# Patient Record
Sex: Male | Born: 1980 | Hispanic: Yes | Marital: Single | Smoking: Never smoker
Health system: Southern US, Community
[De-identification: ages and names within clinical notes are randomized; demographics above are authoritative.]

---

## 2017-09-19 ENCOUNTER — Emergency Department (HOSPITAL_COMMUNITY)
Admission: EM | Admit: 2017-09-19 | Discharge: 2017-09-19 | Disposition: A | Discharge status: Home / Self Care | Payer: Self-pay | Attending: Emergency Medicine | Admitting: Emergency Medicine

## 2017-09-19 ENCOUNTER — Encounter (HOSPITAL_COMMUNITY): Payer: Self-pay | Admitting: Emergency Medicine

## 2017-09-19 ENCOUNTER — Emergency Department (HOSPITAL_COMMUNITY): Payer: Self-pay

## 2017-09-19 DIAGNOSIS — S61301A Unspecified open wound of left index finger with damage to nail, initial encounter: Secondary | ICD-10-CM | POA: Insufficient documentation

## 2017-09-19 DIAGNOSIS — Y939 Activity, unspecified: Secondary | ICD-10-CM | POA: Insufficient documentation

## 2017-09-19 DIAGNOSIS — S6710XA Crushing injury of unspecified finger(s), initial encounter: Secondary | ICD-10-CM

## 2017-09-19 DIAGNOSIS — Z23 Encounter for immunization: Secondary | ICD-10-CM | POA: Insufficient documentation

## 2017-09-19 DIAGNOSIS — S67191A Crushing injury of left index finger, initial encounter: Secondary | ICD-10-CM | POA: Insufficient documentation

## 2017-09-19 DIAGNOSIS — S6992XA Unspecified injury of left wrist, hand and finger(s), initial encounter: Secondary | ICD-10-CM

## 2017-09-19 DIAGNOSIS — Y9289 Other specified places as the place of occurrence of the external cause: Secondary | ICD-10-CM | POA: Insufficient documentation

## 2017-09-19 DIAGNOSIS — W231XXA Caught, crushed, jammed, or pinched between stationary objects, initial encounter: Secondary | ICD-10-CM | POA: Insufficient documentation

## 2017-09-19 DIAGNOSIS — Y990 Civilian activity done for income or pay: Secondary | ICD-10-CM | POA: Insufficient documentation

## 2017-09-19 MED ORDER — LIDOCAINE HCL 2 % IJ SOLN
5.0000 mL | Freq: Once | INTRAMUSCULAR | Status: AC
  Administered 2017-09-19: 100 mg via INTRADERMAL
  Filled 2017-09-19: qty 20

## 2017-09-19 MED ORDER — CEPHALEXIN 500 MG PO CAPS: 500.0000 mg | ORAL_CAPSULE | Freq: Three times a day (TID) | ORAL | 0 refills | Status: AC

## 2017-09-19 MED ORDER — IBUPROFEN 400 MG PO TABS
600.0000 mg | ORAL_TABLET | Freq: Once | ORAL | Status: AC
  Administered 2017-09-19: 23:00:00 600 mg via ORAL
  Filled 2017-09-19: qty 1

## 2017-09-19 MED ORDER — TETANUS-DIPHTH-ACELL PERTUSSIS 5-2.5-18.5 LF-MCG/0.5 IM SUSP
0.5000 mL | Freq: Once | INTRAMUSCULAR | Status: AC
  Administered 2017-09-19: 0.5 mL via INTRAMUSCULAR
  Filled 2017-09-19: qty 0.5

## 2017-09-19 MED ORDER — ACETAMINOPHEN 500 MG PO TABS
1000.0000 mg | ORAL_TABLET | Freq: Once | ORAL | Status: AC
  Administered 2017-09-19: 1000 mg via ORAL
  Filled 2017-09-19: qty 2

## 2017-09-19 MED ORDER — CEPHALEXIN 250 MG PO CAPS
500.0000 mg | ORAL_CAPSULE | Freq: Once | ORAL | Status: AC
  Administered 2017-09-19: 500 mg via ORAL
  Filled 2017-09-19: qty 2

## 2017-09-19 NOTE — ED Triage Notes (Signed)
Interpreter used. Pt states he was at work when a metal beam dropped on L pointer finger. Pt nail is off the finger. Bleeding noted but controlled. Tetanus shot up to date

## 2017-09-19 NOTE — Discharge Instructions (Signed)
Take Tylenol 1000 mg 4 times a day for 1 week. This is the maximum dose of Tylenol (acetaminophen) you can take from all sources. Please check other over-the-counter medications and prescriptions to ensure you are not taking other medications that contain acetaminophen.  You may also take ibuprofen 400 mg 6 times a day alternating with or at the same time as tylenol.  °

## 2017-09-19 NOTE — ED Notes (Signed)
ED Provider at bedside. 

## 2017-09-19 NOTE — ED Provider Notes (Signed)
[facility]-EMERGENCY DEPT Provider Note   CSN: [id number] Arrival date & time: 09/19/17  1636     History   Chief Complaint Chief Complaint  Patient presents with  . Finger Injury    HPI Matthew Dyer is a 36 y.o. male.  HPI   36 year old right-handed male presents to concern for left index finger injury while at work. Reports a steel beam fell onto his left index finger while she was at work at 3:00. Pain was mild without stimulation to the area, but is 8 out of 10 with movement or palpation. He is not sure if he is up-to-date on tetanus. Bleeding is controlled. No other concern for injuries  Interpreter used  History reviewed. No pertinent past medical history.  There are no active problems to display for this patient.   History reviewed. No pertinent surgical history.     Home Medications    Prior to Admission medications   Medication Sig Start Date End Date Taking? Authorizing Provider  cephALEXin (KEFLEX) 500 MG capsule Take 1 capsule (500 mg total) by mouth 3 (three) times daily. 09/19/17 09/26/17  Alvira Monday, MD    Family History No family history on file.  Social History Social History  Substance Use Topics  . Smoking status: Never Smoker  . Smokeless tobacco: Never Used  . Alcohol use No     Allergies   Patient has no known allergies.   Review of Systems Review of Systems  Gastrointestinal: Negative for vomiting.  Musculoskeletal: Positive for arthralgias.  Skin: Positive for wound.     Physical Exam Updated Vital Signs BP (!) 142/97 (BP Location: Right Arm)   Pulse 72   Temp 98.2 F (36.8 C) (Oral)   Resp 16   Ht  (1.651 m)   Wt 72.6 kg (160 lb)   SpO2 98%   BMI 26.63 kg/m   Physical Exam  Constitutional: He is oriented to person, place, and time. He appears well-developed and well-nourished. No distress.  HENT:  Head: Normocephalic and atraumatic.  Eyes: Conjunctivae and EOM are normal.  Neck: Normal range of motion.   Cardiovascular: Normal rate, regular rhythm and intact distal pulses.   Pulmonary/Chest: Effort normal. No respiratory distress.  Musculoskeletal: He exhibits no edema.  Neurological: He is alert and oriented to person, place, and time.  Skin: Skin is warm and dry. He is not diaphoretic.  Nail avulsion to left index finger Thin avulsed areas of skin on medial side and latera tip. approx .75cm crush laceration to nailbed, thin tissue overlying bone  Nursing note and vitals reviewed.    ED Treatments / Results  Labs (all labs ordered are listed, but only abnormal results are displayed) Labs Reviewed - No data to display  EKG  EKG Interpretation None       Radiology Dg Finger Index Left  Result Date: 09/19/2017 CLINICAL DATA:  Metal beam fell on the left index finger. EXAM: LEFT INDEX FINGER 2+V COMPARISON:  None. FINDINGS: Soft tissue and/or fingernail avulsion at the tip of the left index finger. No underlying fracture is identified. Joint spaces are maintained. IMPRESSION: Soft tissue and/or fingernail avulsion involving the tip of the left index finger. No acute fracture is identified or dislocation noted. Electronically Signed   By: Tollie Eth M.D.   On: 09/19/2017 19:37    Procedures .Nerve Block Date/Time: 09/20/2017 12:33 AM Performed by: Alvira Monday Authorized by: Alvira Monday   Consent:    Consent obtained:  Verbal  Risks discussed:  Infection and pain   Alternatives discussed:  No treatment Indications:    Indications:  Pain relief and procedural anesthesia Location:    Body area:  Upper extremity   Upper extremity nerve blocked: digital block. Pre-procedure details:    Skin preparation:  2% chlorhexidine Procedure details (see MAR for exact dosages):    Block needle gauge:  25 G   Injection procedure:  Anatomic landmarks identified Post-procedure details:    Outcome:  Anesthesia achieved   Patient tolerance of procedure:  Tolerated well, no  immediate complications   (including critical care time)  Medications Ordered in ED Medications  Tdap (BOOSTRIX) injection 0.5 mL (0.5 mLs Intramuscular Given 09/19/17 2239)  ibuprofen (ADVIL,MOTRIN) tablet 600 mg (600 mg Oral Given 09/19/17 2237)  acetaminophen (TYLENOL) tablet 1,000 mg (1,000 mg Oral Given 09/19/17 2237)  lidocaine (XYLOCAINE) 2 % (with pres) injection 100 mg (100 mg Intradermal Given 09/19/17 2244)  cephALEXin (KEFLEX) capsule 500 mg (500 mg Oral Given 09/19/17 2345)     Initial Impression / Assessment and Plan / ED Course  I have reviewed the triage vital signs and the nursing notes.  Pertinent labs & imaging results that were available during my care of the patient were reviewed by me and considered in my medical decision making (see chart for details).     36 year old right-handed male who speaks Spanish presents to concern for left index finger injury while at work.  X-ray shows no sign of fracture. Discussed initial injury with Dr. Izora Ribas of hand surgery. Patient's tetanus immunization was updated.  Irrigated and cleaned finger after performing digital block. Debrided thin areas of skin at tip of finger that had been avulsed and were dry, thin, inappropriate for reattachment.  Nail bed with crush type laceration, no significant tissue to bring together. No exposed bone but note thin overlying tissue. Placed xerform to stent nailbed and as dressing to area, covered with kerlex and splint. Placed on empiric keflex.  Will follow up closely with Dr. Izora Ribas in the office. Rec ibuprofen and tylenol for pain. Discussed reasons to return in detail. Patient discharged in stable condition with understanding of reasons to return.   Final Clinical Impressions(s) / ED Diagnoses   Final diagnoses:  Crushing injury of finger, initial encounter  Injury of nail bed of finger of left hand, initial encounter    New Prescriptions Discharge Medication List as of 09/19/2017 11:40 PM      START taking these medications   Details  cephALEXin (KEFLEX) 500 MG capsule Take 1 capsule (500 mg total) by mouth 3 (three) times daily., Starting Wed 09/19/2017, Until Wed 09/26/2017, Print         Alvira Monday, MD 09/20/17 [phone]

## 2018-04-25 IMAGING — CR DG FINGER INDEX 2+V*L*
3 series · 3 of 3 positions shown · non-contrast
Comparison: None.

CLINICAL DATA: Metal beam fell on the left index finger.

EXAM:
LEFT INDEX FINGER 2+V

[finger ap]
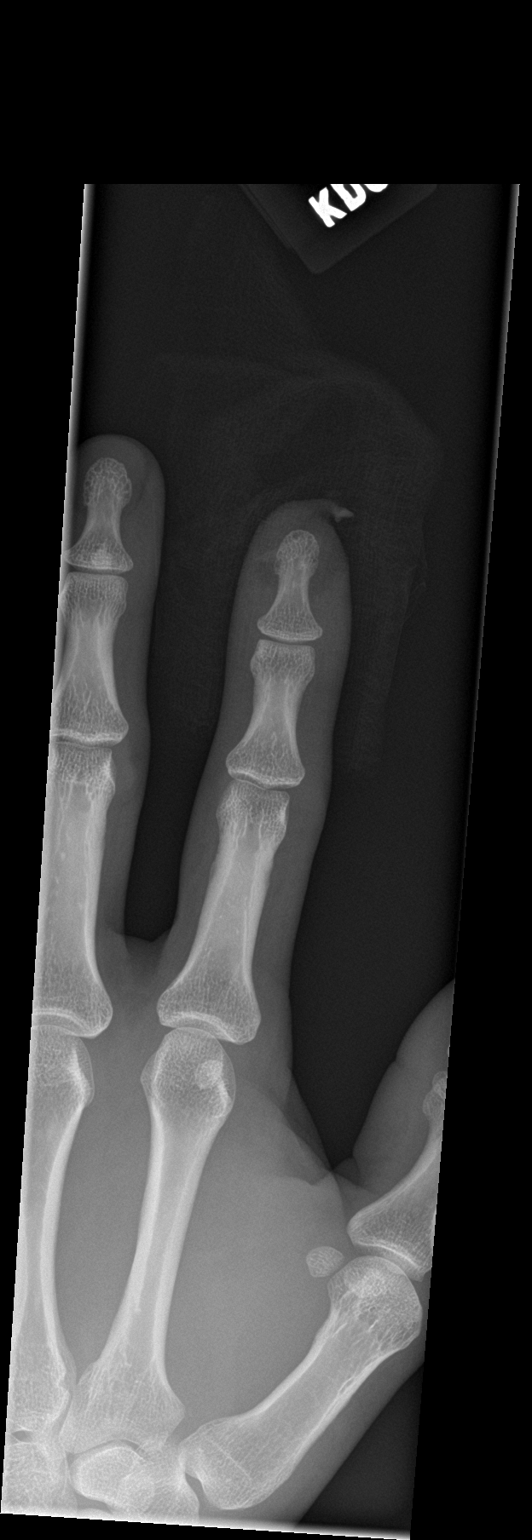

[finger obl]
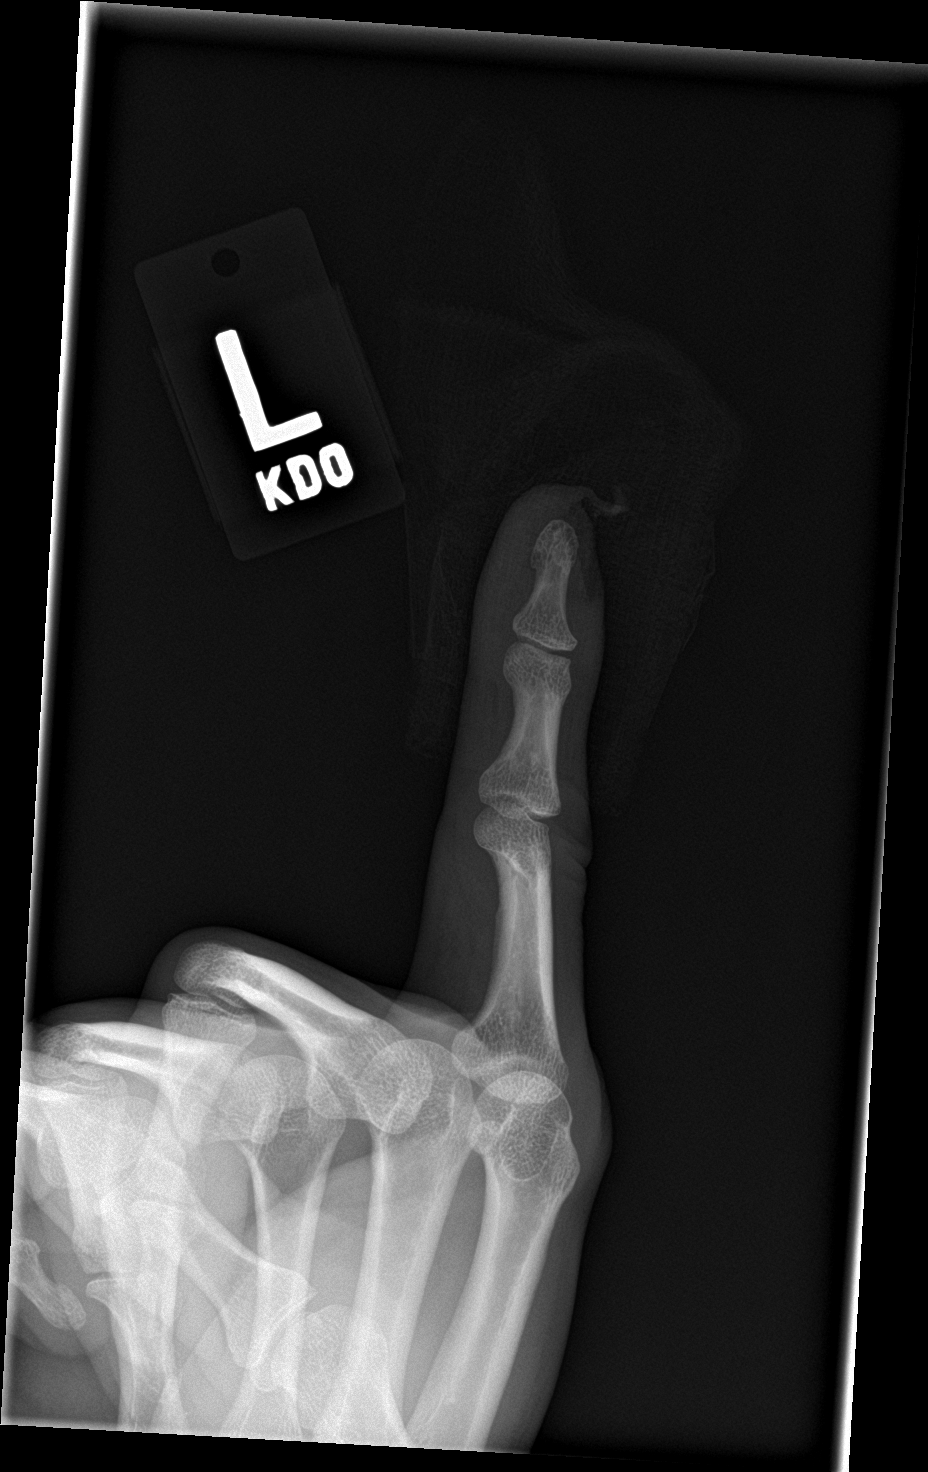

[finger lat]
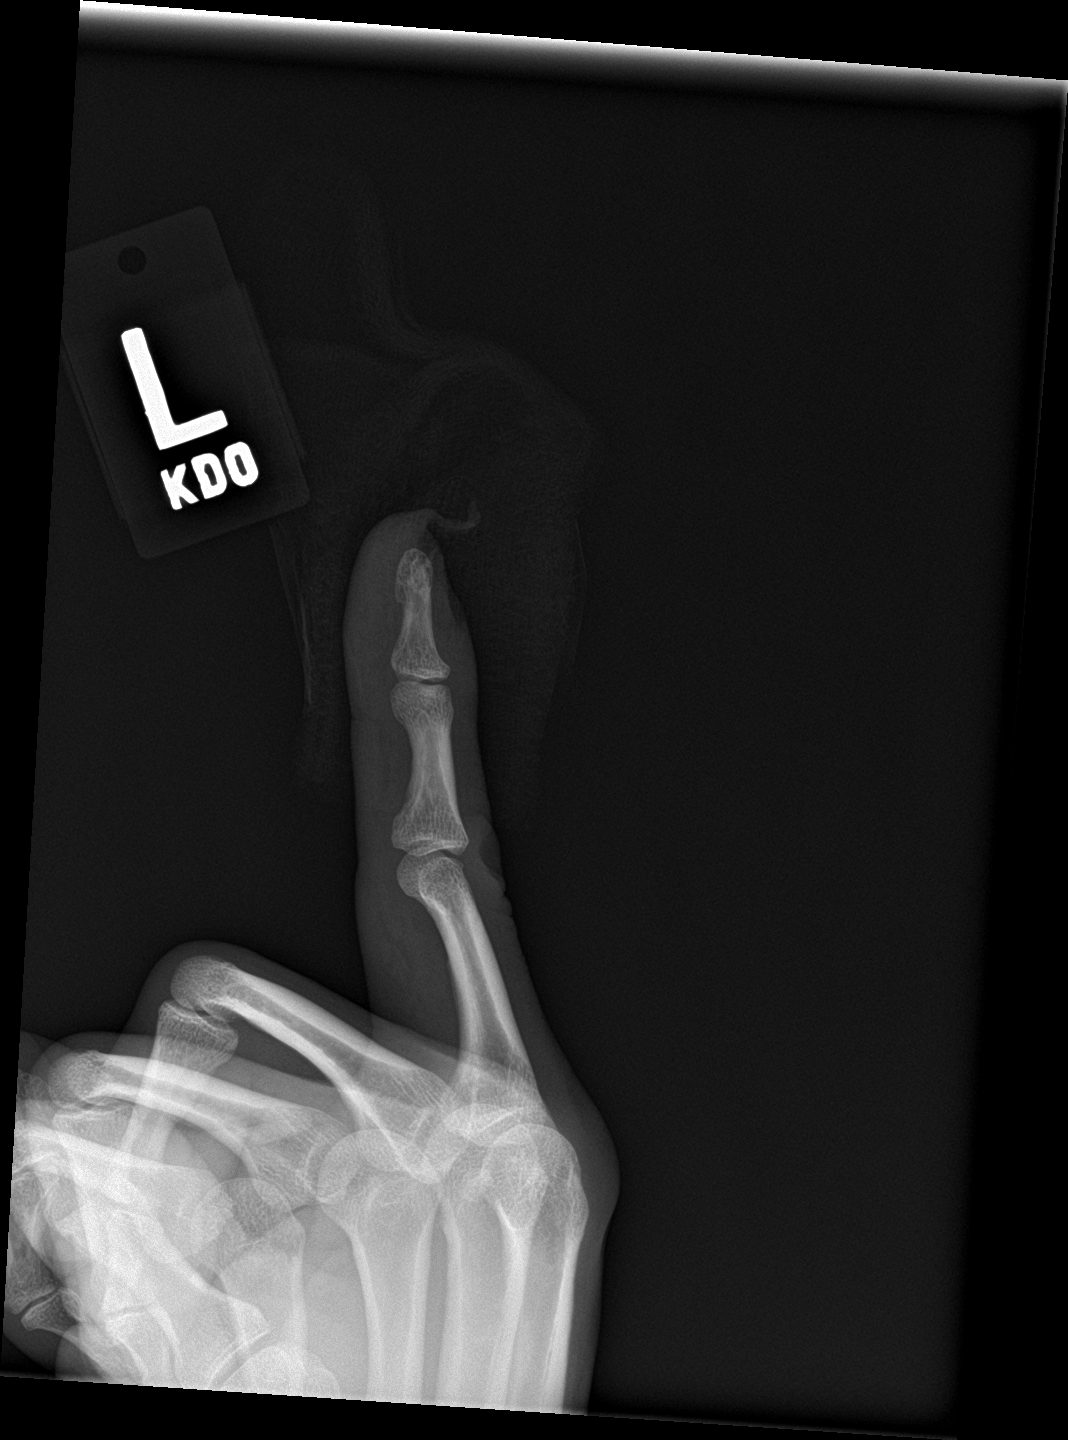

[3 of 3 positions shown; findings below may reference images not displayed]

FINDINGS: Soft tissue and/or fingernail avulsion at the tip of the left index
finger. No underlying fracture is identified. Joint spaces are
maintained.
IMPRESSION: Soft tissue and/or fingernail avulsion involving the tip of the left
index finger. No acute fracture is identified or dislocation noted.
